# Patient Record
Sex: Male | Born: 1965 | Race: White | Hispanic: No | Marital: Married | Smoking: Never smoker
Health system: Southern US, Community
[De-identification: ages and names within clinical notes are randomized; demographics above are authoritative.]

## PROBLEM LIST (undated history)

## (undated) DIAGNOSIS — H8109 Meniere's disease, unspecified ear: Secondary | ICD-10-CM

## (undated) DIAGNOSIS — T7840XA Allergy, unspecified, initial encounter: Secondary | ICD-10-CM

## (undated) DIAGNOSIS — H9191 Unspecified hearing loss, right ear: Secondary | ICD-10-CM

## (undated) DIAGNOSIS — K625 Hemorrhage of anus and rectum: Secondary | ICD-10-CM

## (undated) HISTORY — DX: Unspecified hearing loss, right ear: H91.91

## (undated) HISTORY — DX: Meniere's disease, unspecified ear: H81.09

## (undated) HISTORY — PX: WISDOM TOOTH EXTRACTION: SHX21

## (undated) HISTORY — PX: OTHER SURGICAL HISTORY: SHX169

## (undated) HISTORY — DX: Allergy, unspecified, initial encounter: T78.40XA

## (undated) HISTORY — DX: Hemorrhage of anus and rectum: K62.5

---

## 1998-09-04 ENCOUNTER — Emergency Department (HOSPITAL_COMMUNITY): Admission: EM | Admit: 1998-09-04 | Discharge: 1998-09-04 | Payer: Self-pay | Admitting: Emergency Medicine

## 1998-09-08 ENCOUNTER — Encounter: Payer: Self-pay | Admitting: Otolaryngology

## 1998-09-08 ENCOUNTER — Ambulatory Visit (HOSPITAL_COMMUNITY): Admission: RE | Admit: 1998-09-08 | Discharge: 1998-09-08 | Payer: Self-pay | Admitting: Otolaryngology

## 1999-02-21 ENCOUNTER — Emergency Department (HOSPITAL_COMMUNITY): Admission: EM | Admit: 1999-02-21 | Discharge: 1999-02-21 | Payer: Self-pay | Admitting: Emergency Medicine

## 2004-05-15 ENCOUNTER — Observation Stay (HOSPITAL_COMMUNITY): Admission: EM | Admit: 2004-05-15 | Discharge: 2004-05-15 | Payer: Self-pay | Admitting: *Deleted

## 2016-02-15 ENCOUNTER — Other Ambulatory Visit: Payer: Self-pay | Admitting: Sports Medicine

## 2016-02-15 DIAGNOSIS — M25562 Pain in left knee: Secondary | ICD-10-CM

## 2016-02-23 ENCOUNTER — Ambulatory Visit
Admission: RE | Admit: 2016-02-23 | Discharge: 2016-02-23 | Disposition: A | Discharge status: Home / Self Care | Payer: BLUE CROSS/BLUE SHIELD | Source: Ambulatory Visit | Attending: Sports Medicine | Admitting: Sports Medicine

## 2016-02-23 DIAGNOSIS — M25562 Pain in left knee: Secondary | ICD-10-CM

## 2017-03-27 ENCOUNTER — Other Ambulatory Visit: Payer: Self-pay | Admitting: Otolaryngology

## 2017-03-27 DIAGNOSIS — H8102 Meniere's disease, left ear: Secondary | ICD-10-CM

## 2017-03-27 DIAGNOSIS — H8190 Unspecified disorder of vestibular function, unspecified ear: Principal | ICD-10-CM

## 2017-03-27 DIAGNOSIS — H5509 Other forms of nystagmus: Secondary | ICD-10-CM

## 2017-04-04 ENCOUNTER — Ambulatory Visit
Admission: RE | Admit: 2017-04-04 | Discharge: 2017-04-04 | Disposition: A | Discharge status: Home / Self Care | Payer: BLUE CROSS/BLUE SHIELD | Source: Ambulatory Visit | Attending: Otolaryngology | Admitting: Otolaryngology

## 2017-04-04 DIAGNOSIS — H8102 Meniere's disease, left ear: Secondary | ICD-10-CM

## 2017-04-04 DIAGNOSIS — H8190 Unspecified disorder of vestibular function, unspecified ear: Principal | ICD-10-CM

## 2017-04-04 DIAGNOSIS — H5509 Other forms of nystagmus: Secondary | ICD-10-CM

## 2017-04-04 MED ORDER — GADOBENATE DIMEGLUMINE 529 MG/ML IV SOLN
18.0000 mL | Freq: Once | INTRAVENOUS | Status: AC | PRN
  Administered 2017-04-04: 18 mL via INTRAVENOUS

## 2017-08-29 ENCOUNTER — Encounter: Payer: Self-pay | Admitting: Internal Medicine

## 2017-09-12 ENCOUNTER — Encounter: Payer: Self-pay | Admitting: Internal Medicine

## 2017-09-12 ENCOUNTER — Ambulatory Visit (AMBULATORY_SURGERY_CENTER): Payer: Self-pay

## 2017-09-12 VITALS — Ht 66.0 in | Wt 195.0 lb

## 2017-09-12 DIAGNOSIS — K625 Hemorrhage of anus and rectum: Secondary | ICD-10-CM

## 2017-09-12 MED ORDER — NA SULFATE-K SULFATE-MG SULF 17.5-3.13-1.6 GM/177ML PO SOLN: 1.0000 | Freq: Once | ORAL | 0 refills | Status: AC

## 2017-09-12 NOTE — Progress Notes (Signed)
Per pt, no allergies to soy or egg products.Pt not taking any weight loss meds or using  O2 at home.  Pt refused emmi video. 

## 2017-09-25 ENCOUNTER — Ambulatory Visit (AMBULATORY_SURGERY_CENTER): Payer: BLUE CROSS/BLUE SHIELD | Admitting: Internal Medicine

## 2017-09-25 ENCOUNTER — Encounter: Payer: Self-pay | Admitting: Internal Medicine

## 2017-09-25 VITALS — BP 117/71 | HR 60 | Temp 98.6°F | Resp 13 | Ht 66.0 in | Wt 195.0 lb

## 2017-09-25 DIAGNOSIS — Z1211 Encounter for screening for malignant neoplasm of colon: Secondary | ICD-10-CM

## 2017-09-25 MED ORDER — SODIUM CHLORIDE 0.9 % IV SOLN: 500.0000 mL | Freq: Once | INTRAVENOUS | Status: AC

## 2017-09-25 NOTE — Op Note (Signed)
[facility] Patient Name: Walter Carroll Procedure Date: 09/25/2017 1:58 PM MRN: [id number] Endoscopist: Wilhemina Bonito. Marina Goodell , MD Age: 52 Referring MD:  Date of Birth: [birth year] Gender: Male Account #: [id number] Procedure:                Colonoscopy Indications:              Screening for colorectal malignant neoplasm.                            Incidental minor rectal bleeding Medicines:                Monitored Anesthesia Care Procedure:                Pre-Anesthesia Assessment:                           - Prior to the procedure, a History and Physical                            was performed, and patient medications and                            allergies were reviewed. The patient's tolerance of                            previous anesthesia was also reviewed. The risks                            and benefits of the procedure and the sedation                            options and risks were discussed with the patient.                            All questions were answered, and informed consent                            was obtained. Prior Anticoagulants: The patient has                            taken no previous anticoagulant or antiplatelet                            agents. ASA Grade Assessment: II - A patient with                            mild systemic disease. After reviewing the risks                            and benefits, the patient was deemed in                            satisfactory condition to undergo the procedure.  After obtaining informed consent, the colonoscope                            was passed under direct vision. Throughout the                            procedure, the patient's blood pressure, pulse, and                            oxygen saturations were monitored continuously. The                            Colonoscope was introduced through the anus and                            advanced to the the cecum, identified by                             appendiceal orifice and ileocecal valve. The                            ileocecal valve, appendiceal orifice, and rectum                            were photographed. The quality of the bowel                            preparation was excellent. The colonoscopy was                            performed without difficulty. The patient tolerated                            the procedure well. The bowel preparation used was                            SUPREP. Scope In: 2:08:04 PM Scope Out: 2:20:10 PM Scope Withdrawal Time: 0 hours 9 minutes 48 seconds  Total Procedure Duration: 0 hours 12 minutes 6 seconds  Findings:                 The entire examined colon appeared normal on direct                            and retroflexion views. Complications:            No immediate complications. Estimated blood loss:                            None. Estimated Blood Loss:     Estimated blood loss: none. Impression:               - The entire examined colon is normal on direct and                            retroflexion views.                           -  No specimens collected. Recommendation:           - Repeat colonoscopy in 10 years for screening                            purposes.                           - Patient has a contact number available for                            emergencies. The signs and symptoms of potential                            delayed complications were discussed with the                            patient. Return to normal activities tomorrow.                            Written discharge instructions were provided to the                            patient.                           - Resume previous diet.                           - Continue present medications. Wilhemina Bonito. Marina Goodell, MD 09/25/2017 2:23:30 PM This report has been signed electronically.

## 2017-09-25 NOTE — Patient Instructions (Signed)
YOU HAD AN ENDOSCOPIC PROCEDURE TODAY AT THE [facility]:   Refer to the procedure report that was given to you for any specific questions about what was found during the examination.  If the procedure report does not answer your questions, please call your gastroenterologist to clarify.  If you requested that your care partner not be given the details of your procedure findings, then the procedure report has been included in a sealed envelope for you to review at your convenience later.  YOU SHOULD EXPECT: Some feelings of bloating in the abdomen. Passage of more gas than usual.  Walking can help get rid of the air that was put into your GI tract during the procedure and reduce the bloating. If you had a lower endoscopy (such as a colonoscopy or flexible sigmoidoscopy) you may notice spotting of blood in your stool or on the toilet paper. If you underwent a bowel prep for your procedure, you may not have a normal bowel movement for a few days.  Please Note:  You might notice some irritation and congestion in your nose or some drainage.  This is from the oxygen used during your procedure.  There is no need for concern and it should clear up in a day or so.  SYMPTOMS TO REPORT IMMEDIATELY:   Following lower endoscopy (colonoscopy or flexible sigmoidoscopy):  Excessive amounts of blood in the stool  Significant tenderness or worsening of abdominal pains  Swelling of the abdomen that is new, acute  Fever of 100F or higher  For urgent or emergent issues, a gastroenterologist can be reached at any hour by calling [phone].   DIET:  We do recommend a small meal at first, but then you may proceed to your regular diet.  Drink plenty of fluids but you should avoid alcoholic beverages for 24 hours.  MEDICATIONS:  Continue present medications.  ACTIVITY:  You should plan to take it easy for the rest of today and you should NOT DRIVE or use heavy machinery until tomorrow (because of the  sedation medicines used during the test).    FOLLOW UP: Our staff will call the number listed on your records the next business day following your procedure to check on you and address any questions or concerns that you may have regarding the information given to you following your procedure. If we do not reach you, we will leave a message.  However, if you are feeling well and you are not experiencing any problems, there is no need to return our call.  We will assume that you have returned to your regular daily activities without incident.  If any biopsies were taken you will be contacted by phone or by letter within the next 1-3 weeks.  Please call us at [phone] if you have not heard about the biopsies in 3 weeks.   Thank you for allowing us to provide for your healthcare needs today.   SIGNATURES/CONFIDENTIALITY: You and/or your care partner have signed paperwork which will be entered into your electronic medical record.  These signatures attest to the fact that that the information above on your After Visit Summary has been reviewed and is understood.  Full responsibility of the confidentiality of this discharge information lies with you and/or your care-partner. 

## 2017-09-25 NOTE — Progress Notes (Signed)
Report to PACU, RN, vss, BBS= Clear.  

## 2017-09-26 ENCOUNTER — Telehealth: Payer: Self-pay

## 2017-09-26 NOTE — Telephone Encounter (Signed)
No answer. Left message to call back later today, B.Axxel Gude RN

## 2017-09-26 NOTE — Telephone Encounter (Signed)
   Follow up Call-  Call back number 09/25/2017  Post procedure Call Back phone  # [phone]  Permission to leave phone message Yes  Some recent data might be hidden     Patient questions:  Do you have a fever, pain , or abdominal swelling? No. Pain Score  0 *  Have you tolerated food without any problems? Yes.    Have you been able to return to your normal activities? Yes.    Do you have any questions about your discharge instructions: Diet   No. Medications  No. Follow up visit  No.  Do you have questions or concerns about your Care? No.  Actions: * If pain score is 4 or above: No action needed, pain <4.

## 2018-04-10 DIAGNOSIS — E785 Hyperlipidemia, unspecified: Secondary | ICD-10-CM | POA: Insufficient documentation

## 2019-08-08 IMAGING — MR MR HEAD WO/W CM
14 series · 47 of 48 positions shown · IV contrast (multihance)
Comparison: 05/14/2004.

CLINICAL DATA: Blurred vision.  Imbalance.  Meniere's disease.

EXAM:
MRI HEAD WITHOUT AND WITH CONTRAST
TECHNIQUE: Multiplanar, multiecho pulse sequences of the brain and surrounding
structures were obtained without and with intravenous contrast.
CONTRAST:  18mL MULTIHANCE GADOBENATE DIMEGLUMINE 529 MG/ML IV SOLN

[Series 2: T1 · sagittal · 5.0mm · 0.45mm/px · 1 of 22 slices shown (1 of 4)]
[im 1/22]
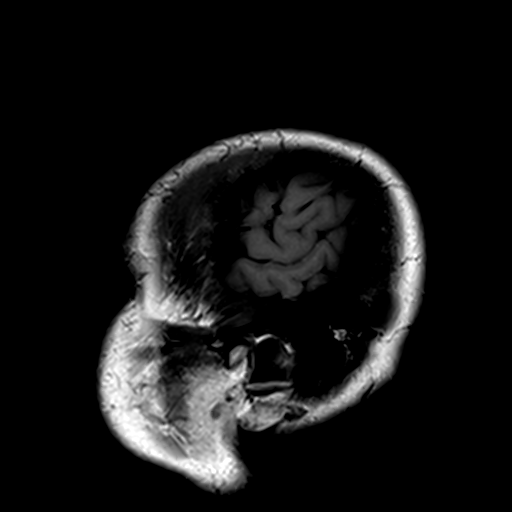

[Series 3: ep2d_diff_(id)_trace · axial · 3.0mm · 1.80mm/px · z∈[-54,+92]mm · 8 of 99 slices shown (1 of 2)]
[im 1/99]
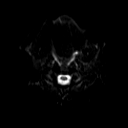
[im 15/99]
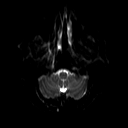
[im 29/99]
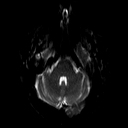
[im 43/99]
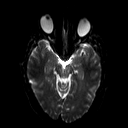
[im 57/99]
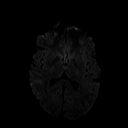
[im 71/99]
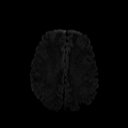
[im 85/99]
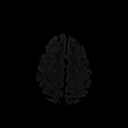
[im 99/99]
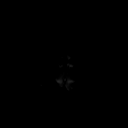

[Series 4: ep2d_diff_(id)_trace_adc · axial · 3.0mm · 1.80mm/px · z∈[-54,+92]mm · 4 of 49 slices shown (1 of 2)]
[im 1/49]
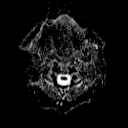
[im 17/49]
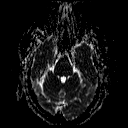
[im 33/49]
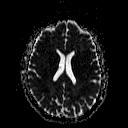
[im 49/49]
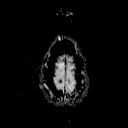

[Series 5: FLAIR · axial · 3.0mm · 0.47mm/px · z∈[-58,+98]mm · 2 of 27 slices shown]
[im 1/27]
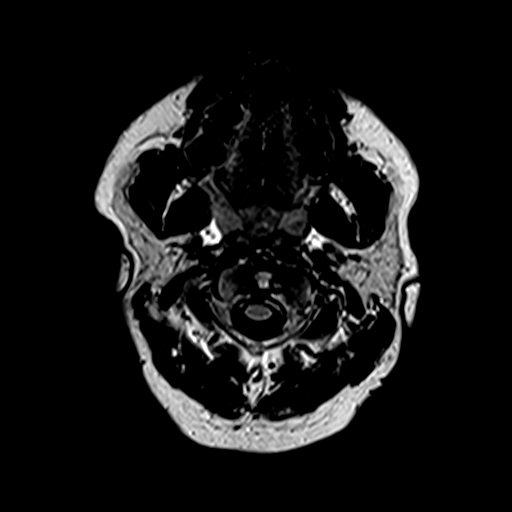
[im 27/27]
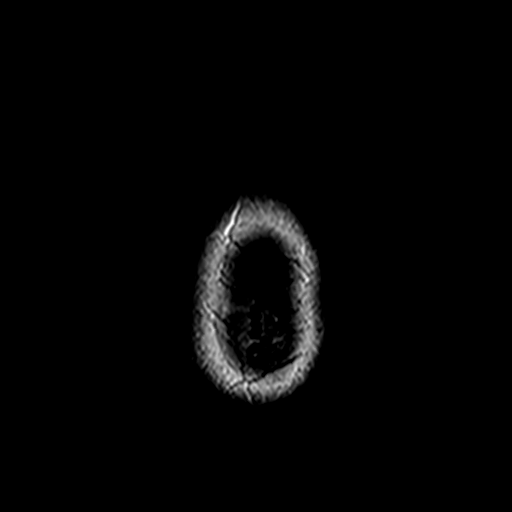

[Series 7: swi_images · axial · 2.0mm · 0.94mm/px · z∈[-58,+100]mm · 6 of 80 slices shown]
[im 1/80]
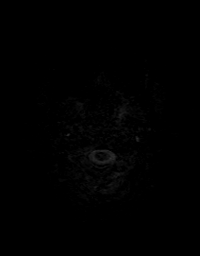
[im 16/80]
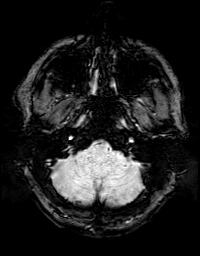
[im 32/80]
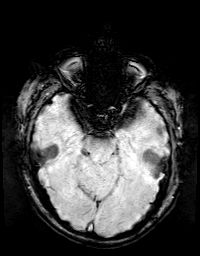
[im 48/80]
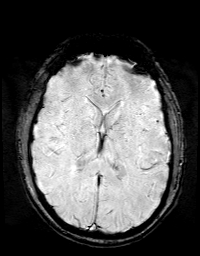
[im 64/80]
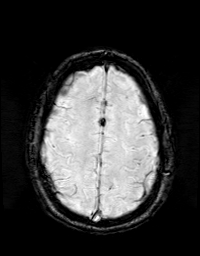
[im 80/80]
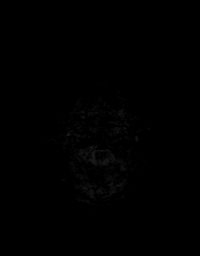

[Series 8: T2 · axial · 5.0mm · 0.47mm/px · z∈[-54,+96]mm · 2 of 25 slices shown]
[im 1/25]
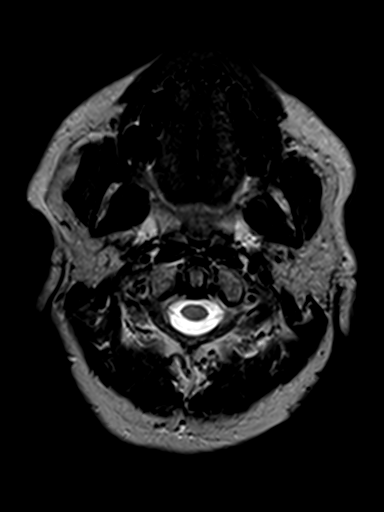
[im 25/25]
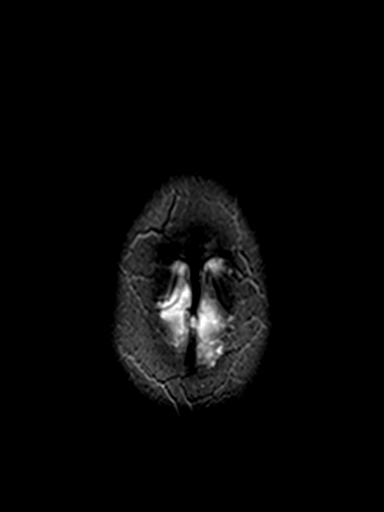

[Series 9: T1 · coronal · 3.0mm · 0.35mm/px · 1 of 11 slices shown (2 of 4)]
[im 1/11]
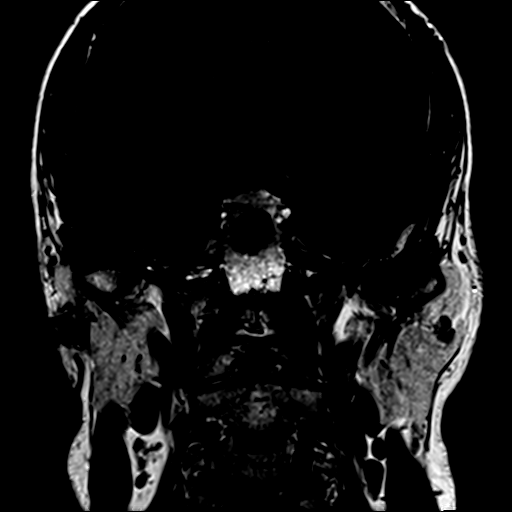

[Series 10: T1 · axial · 3.0mm · 0.35mm/px · 1 of 11 slices shown (3 of 4)]
[im 1/11]
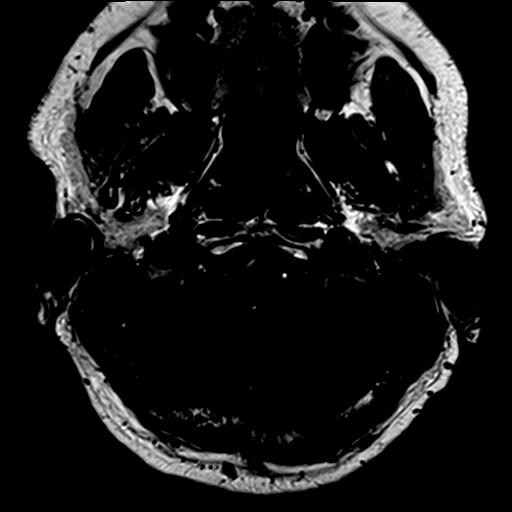

[Series 11: bSSFP · axial · 0.7mm · 0.28mm/px · z∈[-42,-12]mm · 3 of 44 slices shown]
[im 1/44]
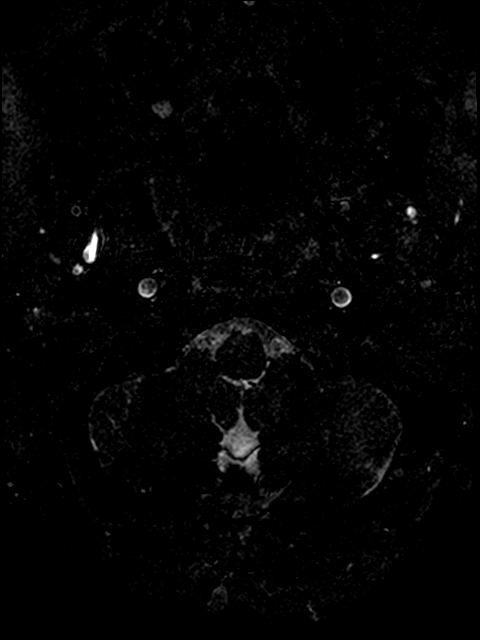
[im 22/44]
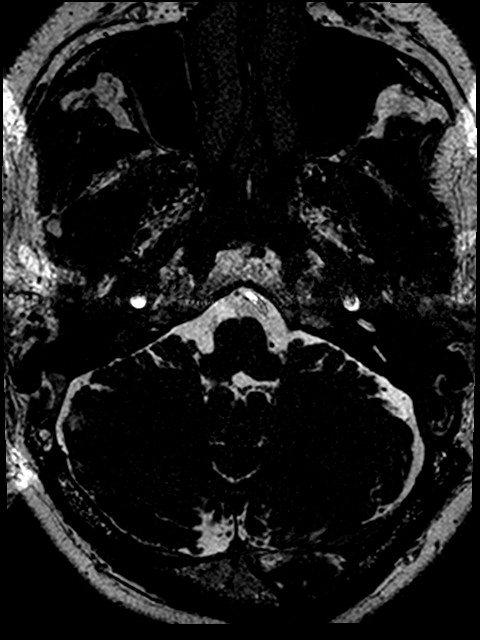
[im 44/44]
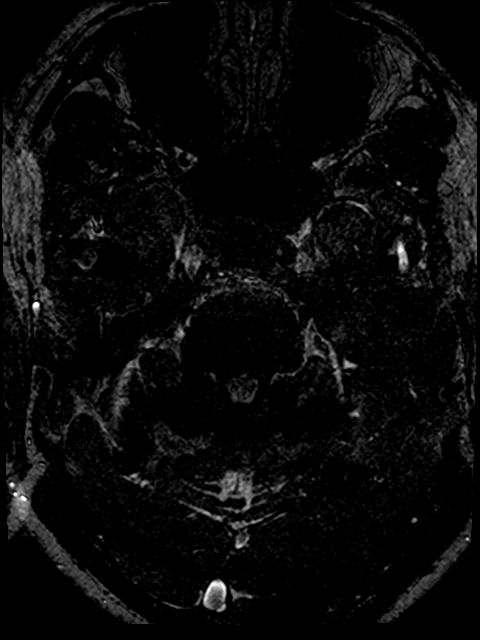

[Series 12: ep2d_diff_(id)_trace · axial · 3.0mm · 1.88mm/px · z∈[-54,+92]mm · 8 of 99 slices shown (2 of 2)]
[im 1/99]
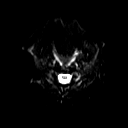
[im 15/99]
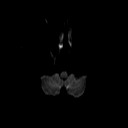
[im 29/99]
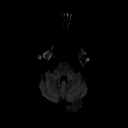
[im 43/99]
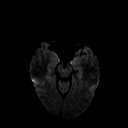
[im 57/99]
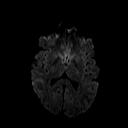
[im 71/99]
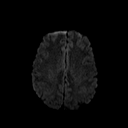
[im 85/99]
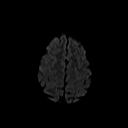
[im 99/99]
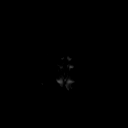

[Series 13: ep2d_diff_(id)_trace_adc · axial · 3.0mm · 1.88mm/px · z∈[-54,+92]mm · 4 of 49 slices shown (2 of 2)]
[im 1/49]
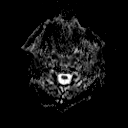
[im 17/49]
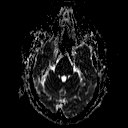
[im 33/49]
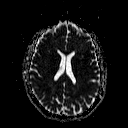
[im 49/49]
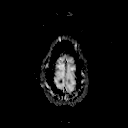

[Series 14: T1 · coronal · 3.0mm · 0.35mm/px · 1 of 11 slices shown (4 of 4)]
[im 1/11]
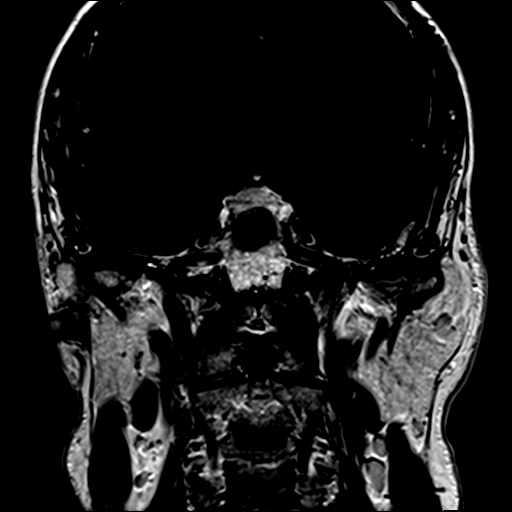

[Series 15: T1 post-contrast · axial · 3.0mm · 0.35mm/px · 1 of 11 slices shown]
[im 1/11]
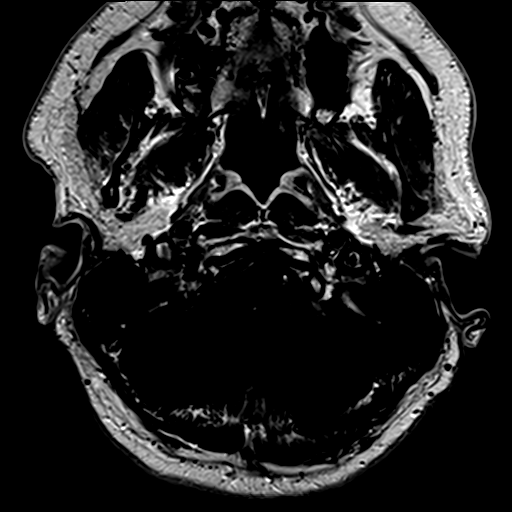

[Series 16: axial (person_name) · axial · 2.0mm · 0.45mm/px · z∈[-50,+62]mm · 5 of 72 slices shown]
[im 1/72]
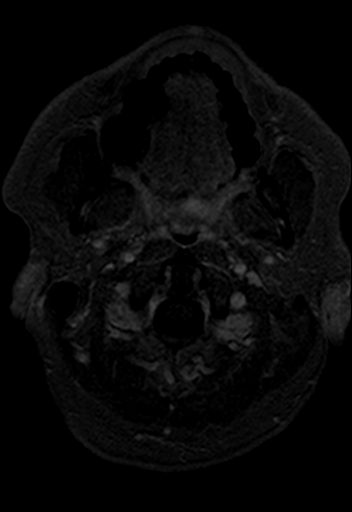
[im 15/72]
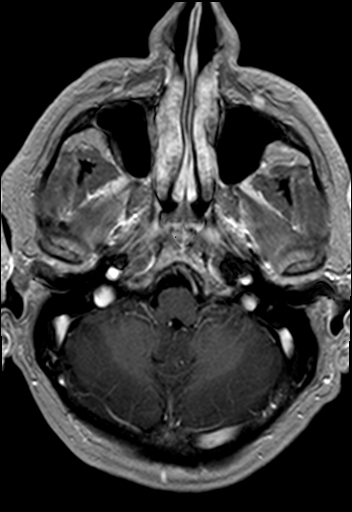
[im 29/72]
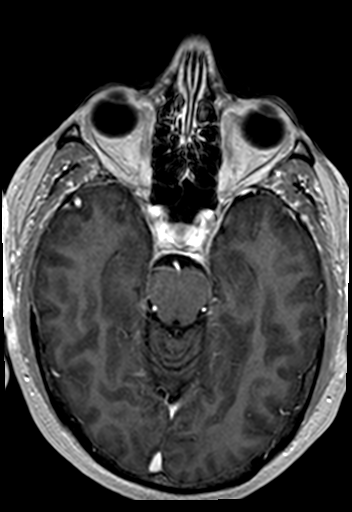
[im 43/72]
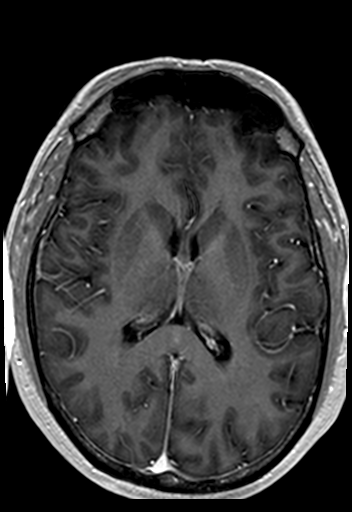
[im 57/72]
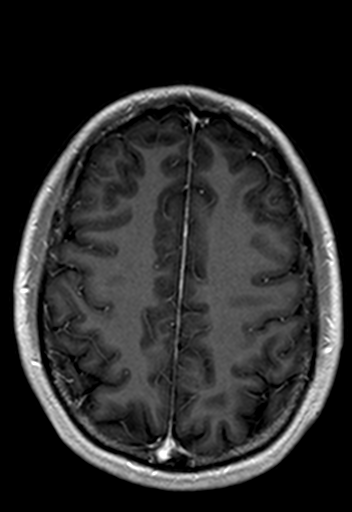

[47 of 48 positions shown; findings below may reference images not displayed]

FINDINGS: Brain: No evidence for acute infarction, hemorrhage, mass lesion,
hydrocephalus, or extra-axial fluid. Normal for age cerebral volume.
No significant white matter disease.

Thin-section imaging through the posterior fossa reveals no evidence
for vestibular schwannoma or posterior fossa mass. No labyrinthine
enhancement. No temporal bone inflammatory process. Post infusion
imaging through the entire head demonstrates no abnormal enhancement
of brain or meninges.

Vascular: Normal flow voids.  Dominant LEFT vertebral.

Skull and upper cervical spine: Normal marrow signal.

Sinuses/Orbits: Negative.

Other: None.No change from priors.
IMPRESSION: Negative exam. No cause is seen for the reported symptoms. No
temporal bone inflammatory process or labyrinthine enhancement.

Normal for age cerebral volume without significant white matter
disease.

## 2019-08-23 DIAGNOSIS — H9312 Tinnitus, left ear: Secondary | ICD-10-CM | POA: Insufficient documentation

## 2019-08-23 DIAGNOSIS — H8102 Meniere's disease, left ear: Secondary | ICD-10-CM | POA: Insufficient documentation

## 2019-08-29 DIAGNOSIS — H9120 Sudden idiopathic hearing loss, unspecified ear: Secondary | ICD-10-CM | POA: Insufficient documentation

## 2019-09-12 DIAGNOSIS — H7292 Unspecified perforation of tympanic membrane, left ear: Secondary | ICD-10-CM | POA: Insufficient documentation

## 2023-10-30 ENCOUNTER — Encounter: Payer: Self-pay | Admitting: Dermatology

## 2023-10-30 ENCOUNTER — Ambulatory Visit (INDEPENDENT_AMBULATORY_CARE_PROVIDER_SITE_OTHER): Admitting: Dermatology

## 2023-10-30 DIAGNOSIS — L578 Other skin changes due to chronic exposure to nonionizing radiation: Secondary | ICD-10-CM

## 2023-10-30 DIAGNOSIS — W908XXA Exposure to other nonionizing radiation, initial encounter: Secondary | ICD-10-CM

## 2023-10-30 DIAGNOSIS — B353 Tinea pedis: Secondary | ICD-10-CM | POA: Diagnosis not present

## 2023-10-30 DIAGNOSIS — L821 Other seborrheic keratosis: Secondary | ICD-10-CM

## 2023-10-30 DIAGNOSIS — Z1283 Encounter for screening for malignant neoplasm of skin: Secondary | ICD-10-CM | POA: Diagnosis not present

## 2023-10-30 DIAGNOSIS — L814 Other melanin hyperpigmentation: Secondary | ICD-10-CM

## 2023-10-30 DIAGNOSIS — D1801 Hemangioma of skin and subcutaneous tissue: Secondary | ICD-10-CM

## 2023-10-30 DIAGNOSIS — D229 Melanocytic nevi, unspecified: Secondary | ICD-10-CM

## 2023-10-30 NOTE — Progress Notes (Signed)
   Follow-Up Visit   Subjective  Walter Carroll is a 58 y.o. male who presents for the following: Skin Cancer Screening and Full Body Skin Exam  The patient presents for Total-Body Skin Exam (TBSE) for skin cancer screening and mole check. The patient has spots, moles and lesions to be evaluated, some may be new or changing and the patient may have concern these could be cancer.  No hx skin cancer  The following portions of the chart were reviewed this encounter and updated as appropriate: medications, allergies, medical history  Review of Systems:  No other skin or systemic complaints except as noted in HPI or Assessment and Plan.  Objective  Well appearing patient in no apparent distress; mood and affect are within normal limits.  A full examination was performed including scalp, head, eyes, ears, nose, lips, neck, chest, axillae, abdomen, back, buttocks, bilateral upper extremities, bilateral lower extremities, hands, feet, fingers, toes, fingernails, and toenails. All findings within normal limits unless otherwise noted below.   Relevant physical exam findings are noted in the Assessment and Plan.    Assessment & Plan   SKIN CANCER SCREENING PERFORMED TODAY.  ACTINIC DAMAGE - Chronic condition, secondary to cumulative UV/sun exposure - diffuse scaly erythematous macules with underlying dyspigmentation - Recommend daily broad spectrum sunscreen SPF 30+ to sun-exposed areas, reapply every 2 hours as needed.  - Staying in the shade or wearing long sleeves, sun glasses (UVA+UVB protection) and wide brim hats (4-inch brim around the entire circumference of the hat) are also recommended for sun protection.  - Call for new or changing lesions.  LENTIGINES, SEBORRHEIC KERATOSES, HEMANGIOMAS - Benign normal skin lesions - Benign-appearing - Call for any changes  MELANOCYTIC NEVI - Tan-brown and/or pink-flesh-colored symmetric macules and papules - Benign appearing on exam today -  Observation - Call clinic for new or changing moles - Recommend daily use of broad spectrum spf 30+ sunscreen to sun-exposed areas.    TINEA PEDIS Exam: Scaling on plantar feet and maceration of web spaces b/l   Treatment Plan: Avoid wet socks, bare feet at public pools Start OTC terbinafine cream BID until clear  TINEA PEDIS OF BOTH FEET   MULTIPLE BENIGN NEVI   LENTIGINES   ACTINIC ELASTOSIS   SEBORRHEIC KERATOSES   CHERRY ANGIOMA   Return in about 1 year (around 10/29/2024) for FBSE Dr Claudene .   Documentation: I have reviewed the above documentation for accuracy and completeness, and I agree with the above.  Boneta Claudene, MD

## 2023-10-30 NOTE — Patient Instructions (Signed)
 Lamisil cream: apply twice per day to bottoms of feet and between toes to clear skin fungus

## 2024-10-29 ENCOUNTER — Ambulatory Visit: Admitting: Dermatology
